# Patient Record
Sex: Male | Born: 1980 | Race: Black or African American | Hispanic: No | Marital: Married | Smoking: Never smoker
Health system: Southern US, Community
[De-identification: ages and names within clinical notes are randomized; demographics above are authoritative.]

---

## 2016-04-18 ENCOUNTER — Emergency Department (HOSPITAL_BASED_OUTPATIENT_CLINIC_OR_DEPARTMENT_OTHER)
Admission: EM | Admit: 2016-04-18 | Discharge: 2016-04-18 | Disposition: A | Discharge status: Home / Self Care | Payer: BLUE CROSS/BLUE SHIELD | Attending: Emergency Medicine | Admitting: Emergency Medicine

## 2016-04-18 ENCOUNTER — Encounter (HOSPITAL_BASED_OUTPATIENT_CLINIC_OR_DEPARTMENT_OTHER): Payer: Self-pay | Admitting: *Deleted

## 2016-04-18 DIAGNOSIS — M549 Dorsalgia, unspecified: Secondary | ICD-10-CM

## 2016-04-18 DIAGNOSIS — M545 Low back pain: Secondary | ICD-10-CM | POA: Insufficient documentation

## 2016-04-18 MED ORDER — METHOCARBAMOL 500 MG PO TABS: 500.0000 mg | ORAL_TABLET | Freq: Two times a day (BID) | ORAL | 0 refills | Status: DC

## 2016-04-18 MED ORDER — LIDO-CAPSAICIN-MEN-METHYL SAL 0.5-0.035-5-20 % EX PTCH: 1.0000 | MEDICATED_PATCH | Freq: Two times a day (BID) | CUTANEOUS | 0 refills | Status: AC | PRN

## 2016-04-18 MED FILL — METHOCARBAMOL 500 MG TABLET: 500 | 10 days supply | Qty: 20 | Fill #0

## 2016-04-18 NOTE — Discharge Instructions (Signed)
Take the prescribed medication as directed. °Follow-up with your primary care doctor. °Return to the ED for new or worsening symptoms. °

## 2016-04-18 NOTE — ED Provider Notes (Signed)
[facility]   CSN: [id number] Arrival date & time: 04/18/16  0820     History   Chief Complaint Chief Complaint  Patient presents with  . Back Pain    HPI Gerald Bell is a 35 y.o. male.  The history is provided by the patient and medical records.  Back Pain      35 year old male with no significant past medical history here with right lower back pain. Reports this is been ongoing for the past 2 days. He denies any injury, trauma, or falls. 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 This requires him to lift it above his head in place on his shoulder. States when he has change in position pain is worsened. He denies any radiation of pain into the legs. No numbness or weakness of the legs. No difficulty walking. No bowel or bladder incontinence. Patient states he did take some Motrin yesterday without any relief. No prior history of back injuries or surgeries in the past.  History reviewed. No pertinent past medical history.  There are no active problems to display for this patient.   History reviewed. No pertinent surgical history.     Home Medications    Prior to Admission medications   Not on File    Family History History reviewed. No pertinent family history.  Social History Social History  Substance Use Topics  . Smoking status: Never Smoker  . Smokeless tobacco: Never Used  . Alcohol use Not on file     Allergies   Patient has no known allergies.   Review of Systems Review of Systems  Musculoskeletal: Positive for back pain.  All other systems reviewed and are negative.    Physical Exam Updated Vital Signs BP 140/90 (BP Location: Right Arm)   Pulse 77   Temp 97.8 F (36.6 C) (Oral)   Resp 18   Ht 6\' 1"  (1.854 m)   Wt 86.2 kg   SpO2 100%   BMI 25.07 kg/m   Physical Exam  Constitutional: He is  oriented to person, place, and time. He appears well-developed and well-nourished.  HENT:  Head: Normocephalic and atraumatic.  Mouth/Throat: Oropharynx is clear and moist.  Eyes: Conjunctivae and EOM are normal. Pupils are equal, round, and reactive to light.  Neck: Normal range of motion.  Cardiovascular: Normal rate, regular rhythm and normal heart sounds.   Pulmonary/Chest: Effort normal and breath sounds normal.  Abdominal: Soft. Bowel sounds are normal.  Musculoskeletal: Normal range of motion.  Right lumbar paraspinal region with noted tenderness, no midline step-off or deformity, full range of motion maintained, normal strength and sensation of both legs, normal gait  Neurological: He is alert and oriented to person, place, and time.  Skin: Skin is warm and dry.  Psychiatric: He has a normal mood and affect.  Nursing note and vitals reviewed.    ED Treatments / Results  Labs (all labs ordered are listed, but only abnormal results are displayed) Labs Reviewed - No data to display  EKG  EKG Interpretation None       Radiology No results found.  Procedures Procedures (including critical care time)  Medications Ordered in ED Medications - No data to display   Initial Impression / Assessment and Plan / ED Course  I have reviewed the triage vital signs and the nursing notes.  Pertinent labs & imaging  results that were available during my care of the patient were reviewed by me and considered in my medical decision making (see chart for details).  Clinical Course    35 rolled male here with low back pain. Does report heavy lifting at work. Right lumbar paraspinal region with noted tenderness. No midline step-off or deformity. Neurologically intact without any signs or symptoms concerning for cauda equina. Suspect lumbar strain. Will treat symptomatically with Robaxin and lidocaine patch.  FU with PCP.  Discussed plan with patient, he acknowledged understanding and  agreed with plan of care.  Return precautions given for new or worsening symptoms.  Final Clinical Impressions(s) / ED Diagnoses   Final diagnoses:  Back pain, unspecified back location, unspecified back pain laterality, unspecified chronicity    New Prescriptions New Prescriptions   LIDO-CAPSAICIN-MEN-METHYL SAL (MEDI-PATCH-LIDOCAINE) 0.5-0.035-5-20 % PTCH    Apply 1 patch topically every 12 (twelve) hours as needed (back pain).   METHOCARBAMOL (ROBAXIN) 500 MG TABLET    Take 1 tablet (500 mg total) by mouth 2 (two) times daily.     Garlon HatchetLisa M Lounette Sloan, PA-C 04/18/16 [id number]    Heide Scaleshristopher J Tegeler, MD 04/18/16 2035

## 2016-04-18 NOTE — ED Triage Notes (Signed)
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 Denies specific injury or trauma.

## 2021-10-27 ENCOUNTER — Other Ambulatory Visit: Payer: Self-pay

## 2021-10-27 ENCOUNTER — Encounter (HOSPITAL_BASED_OUTPATIENT_CLINIC_OR_DEPARTMENT_OTHER): Payer: Self-pay

## 2021-10-27 ENCOUNTER — Emergency Department (HOSPITAL_BASED_OUTPATIENT_CLINIC_OR_DEPARTMENT_OTHER)
Admission: EM | Admit: 2021-10-27 | Discharge: 2021-10-27 | Disposition: A | Discharge status: Home / Self Care | Payer: BC Managed Care – PPO | Attending: Emergency Medicine | Admitting: Emergency Medicine

## 2021-10-27 ENCOUNTER — Emergency Department (HOSPITAL_BASED_OUTPATIENT_CLINIC_OR_DEPARTMENT_OTHER): Payer: BC Managed Care – PPO

## 2021-10-27 DIAGNOSIS — M19012 Primary osteoarthritis, left shoulder: Secondary | ICD-10-CM | POA: Insufficient documentation

## 2021-10-27 DIAGNOSIS — W230XXA Caught, crushed, jammed, or pinched between moving objects, initial encounter: Secondary | ICD-10-CM | POA: Insufficient documentation

## 2021-10-27 DIAGNOSIS — S46912A Strain of unspecified muscle, fascia and tendon at shoulder and upper arm level, left arm, initial encounter: Secondary | ICD-10-CM | POA: Diagnosis not present

## 2021-10-27 DIAGNOSIS — S4992XA Unspecified injury of left shoulder and upper arm, initial encounter: Secondary | ICD-10-CM | POA: Diagnosis present

## 2021-10-27 DIAGNOSIS — Y990 Civilian activity done for income or pay: Secondary | ICD-10-CM | POA: Insufficient documentation

## 2021-10-27 MED ORDER — DICLOFENAC SODIUM 1 % EX GEL: 2.0000 g | Freq: Four times a day (QID) | CUTANEOUS | 0 refills | Status: AC

## 2021-10-27 MED ORDER — NAPROXEN 500 MG PO TABS: 500.0000 mg | ORAL_TABLET | Freq: Two times a day (BID) | ORAL | 0 refills | Status: AC

## 2021-10-27 MED ORDER — METHOCARBAMOL 500 MG PO TABS: 500.0000 mg | ORAL_TABLET | Freq: Two times a day (BID) | ORAL | 0 refills | Status: AC

## 2021-10-27 NOTE — Discharge Instructions (Addendum)
Take the medications as prescribed to help with your symptoms. Your x-ray showed degenerative changes and calcifications near your shoulder joint that could be causing your symptoms. Complete the exercises to prevent stiffness and worsening pain. Return to the ER if you start to experience worsening symptoms, injuries or falls, trouble moving your arm, numbness, weakness

## 2021-10-27 NOTE — ED Triage Notes (Signed)
Patient states he was working on a table last night when he felt like something got "caught" in his left shoulder and neck causing pain

## 2021-10-27 NOTE — ED Provider Notes (Signed)
[facility] EMERGENCY DEPARTMENT Provider Note   CSN: [id number] Arrival date & time: 10/27/21  1013     History  Chief Complaint  Patient presents with   Shoulder Pain    Gerald Bell is a 41 y.o. male who presents to ED with a chief complaint of left shoulder pain.  States that last night he was working on a table when he started experiencing pain after he finished.  He did not take any medications to help with his pain.  Pain is worse with movement but denies any tenderness to palpation.  He has had bilateral shoulder surgeries before for "muscle tears when I was much younger playing football."  He denies any injuries or falls recently, numbness, weakness, neck stiffness, fever.   Shoulder Pain Associated symptoms: no fever        Home Medications Prior to Admission medications   Medication Sig Start Date End Date Taking? Authorizing Provider  diclofenac Sodium (VOLTAREN) 1 % GEL Apply 2 g topically 4 (four) times daily. 10/27/21  Yes Kristle Wesch, PA-C  Lido-Capsaicin-Men-Methyl Sal (MEDI-PATCH-LIDOCAINE) 0.5-0.035-5-20 % PTCH Apply 1 patch topically every 12 (twelve) hours as needed (back pain). 04/18/16   Garlon Hatchet, PA-C  methocarbamol (ROBAXIN) 500 MG tablet Take 1 tablet (500 mg total) by mouth 2 (two) times daily. 10/27/21  Yes Nela Bascom, PA-C  naproxen (NAPROSYN) 500 MG tablet Take 1 tablet (500 mg total) by mouth 2 (two) times daily. 10/27/21  Yes Patsy Varma, PA-C      Allergies    Patient has no known allergies.    Review of Systems   Review of Systems  Constitutional:  Negative for chills and fever.  Musculoskeletal:  Positive for myalgias.  Neurological:  Negative for weakness and numbness.    Physical Exam Updated Vital Signs BP (!) 139/91   Pulse 68   Temp 97.8 F (36.6 C) (Oral)   Resp 16   SpO2 100%  Physical Exam Vitals and nursing note reviewed.  Constitutional:      General: He is not in acute distress.    Appearance:  He is well-developed. He is not diaphoretic.  HENT:     Head: Normocephalic and atraumatic.  Eyes:     General: No scleral icterus.    Conjunctiva/sclera: Conjunctivae normal.  Pulmonary:     Effort: Pulmonary effort is normal. No respiratory distress.  Musculoskeletal:        General: No tenderness. Normal range of motion.     Cervical back: Normal range of motion.     Comments: No tenderness palpation of the cervical spine at the midline or paraspinal musculature.  No tenderness to palpation of the left shoulder.  No deformities.  2+ radial pulse palpated.  Normal range of motion of bilateral shoulders.  Skin:    Findings: No rash.  Neurological:     Mental Status: He is alert.     ED Results / Procedures / Treatments   Labs (all labs ordered are listed, but only abnormal results are displayed) Labs Reviewed - No data to display  EKG None  Radiology DG Shoulder Left  Result Date: 10/27/2021 CLINICAL DATA:  Pain EXAM: LEFT SHOULDER - 2+ VIEW COMPARISON:  None Available. FINDINGS: No fracture or dislocation is seen. Degenerative changes are noted with bony spurs in the inferior aspect of glenohumeral joint. There is small linear smooth marginated calcification adjacent to the proximal humerus seen in the axillary view. This may suggest calcific bursitis or tendinosis. IMPRESSION:  No fracture or dislocation is seen in the left shoulder. Degenerative changes are noted with bony spurs. Small linear calcification adjacent to the proximal humerus suggests possible calcific bursitis or calcific tendinosis. Electronically Signed   By: Ernie Avena M.D.   On: 10/27/2021 10:53    Procedures Procedures    Medications Ordered in ED Medications - No data to display  ED Course/ Medical Decision Making/ A&P                           Medical Decision Making Amount and/or Complexity of Data Reviewed Radiology: ordered.  Risk Prescription drug management.   41 year old male  presenting to the ED for left shoulder pain.  Symptoms began yesterday while he was working on a table.  Prior bilateral shoulder surgeries several decades ago due to overuse from sports.  He denies any injury or trauma, numbness, weakness, neck stiffness.  On exam there is no tenderness palpation cervical spine or the shoulder.  No deformities.  There is normal range of motion of bilateral shoulders.  Area is neurovascularly intact.  He is afebrile without recent use of antipyretic.  X-ray of the left shoulder shows degenerative changes and calcifications which could be causing his symptoms today.  Doubt infectious or vascular cause based on physical exam findings and history.  Will treat with muscle relaxer, NSAIDs and range of motion exercises.  Patient is agreeable to the plan.  Return precautions given  Patient is hemodynamically stable, in NAD, and able to ambulate in the ED. Evaluation does not show pathology that would require ongoing emergent intervention or inpatient treatment. I explained the diagnosis to the patient. Pain has been managed and has no complaints prior to discharge. Patient is comfortable with above plan and is stable for discharge at this time. All questions were answered prior to disposition. Strict return precautions for returning to the ED were discussed. Encouraged follow up with PCP.   An After Visit Summary was printed and given to the patient.   Portions of this note were generated with Scientist, clinical (histocompatibility and immunogenetics). Dictation errors may occur despite best attempts at proofreading.        Final Clinical Impression(s) / ED Diagnoses Final diagnoses:  Strain of left shoulder, initial encounter  Osteoarthritis of left shoulder, unspecified osteoarthritis type    Rx / DC Orders ED Discharge Orders          Ordered    methocarbamol (ROBAXIN) 500 MG tablet  2 times daily        10/27/21 1101    naproxen (NAPROSYN) 500 MG tablet  2 times daily        10/27/21 1101     diclofenac Sodium (VOLTAREN) 1 % GEL  4 times daily        10/27/21 1101              Dietrich Pates, PA-C 10/27/21 1103    Gwyneth Sprout, MD 11/03/21 0019

## 2023-06-05 IMAGING — CR DG SHOULDER 2+V*L*
3 series · 3 of 3 positions shown · non-contrast
Comparison: None Available.

CLINICAL DATA: Pain

EXAM:
LEFT SHOULDER - 2+ VIEW

[w shoulder grashey left]
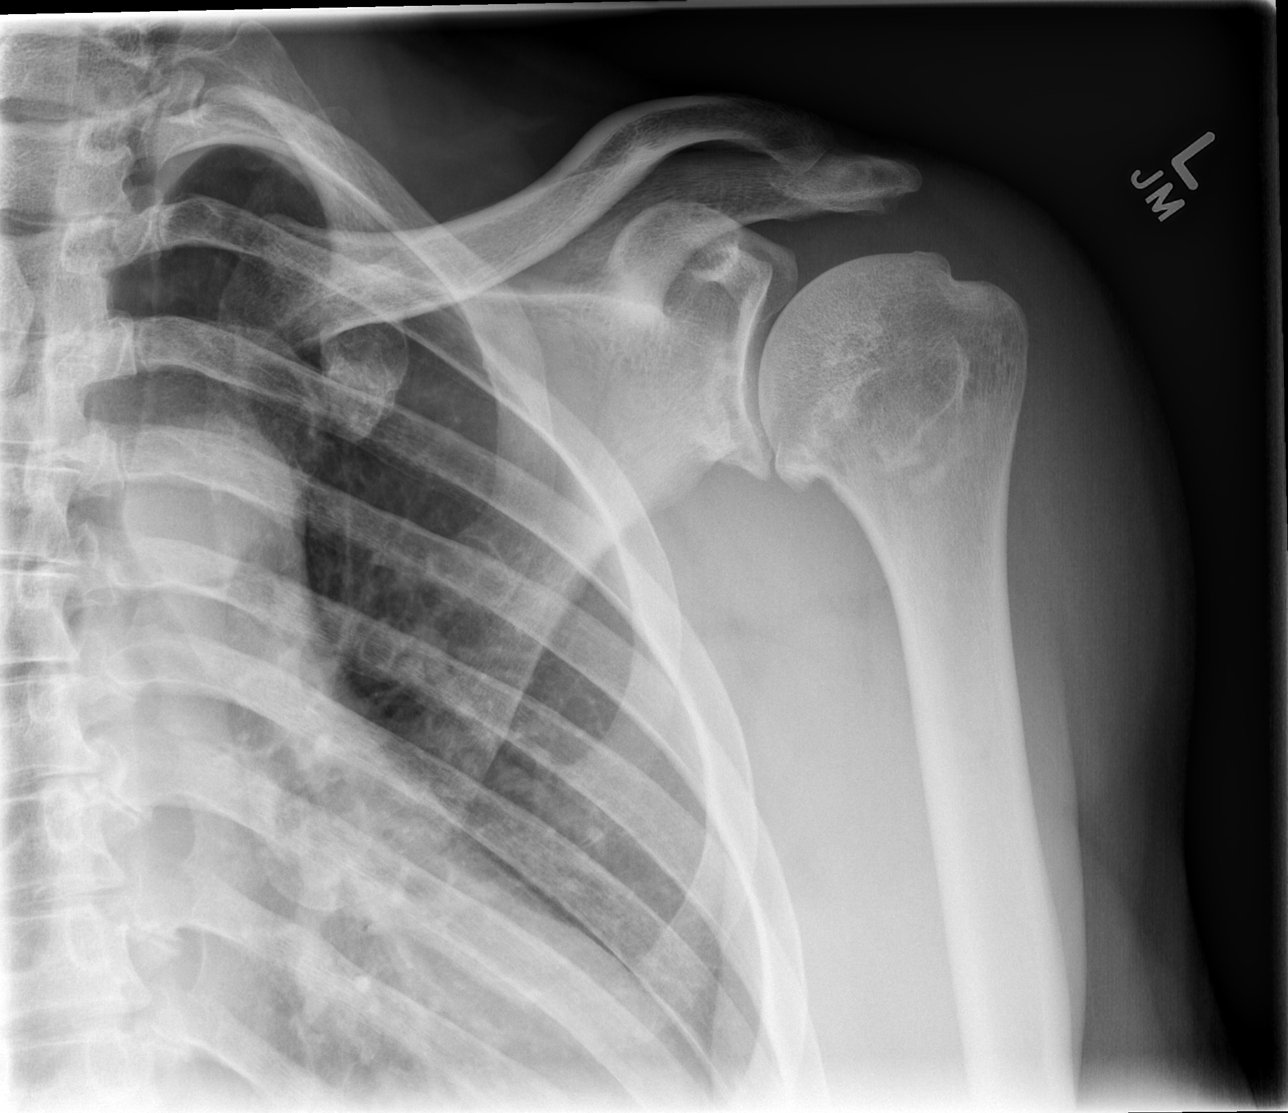

[w shoulder y view left]
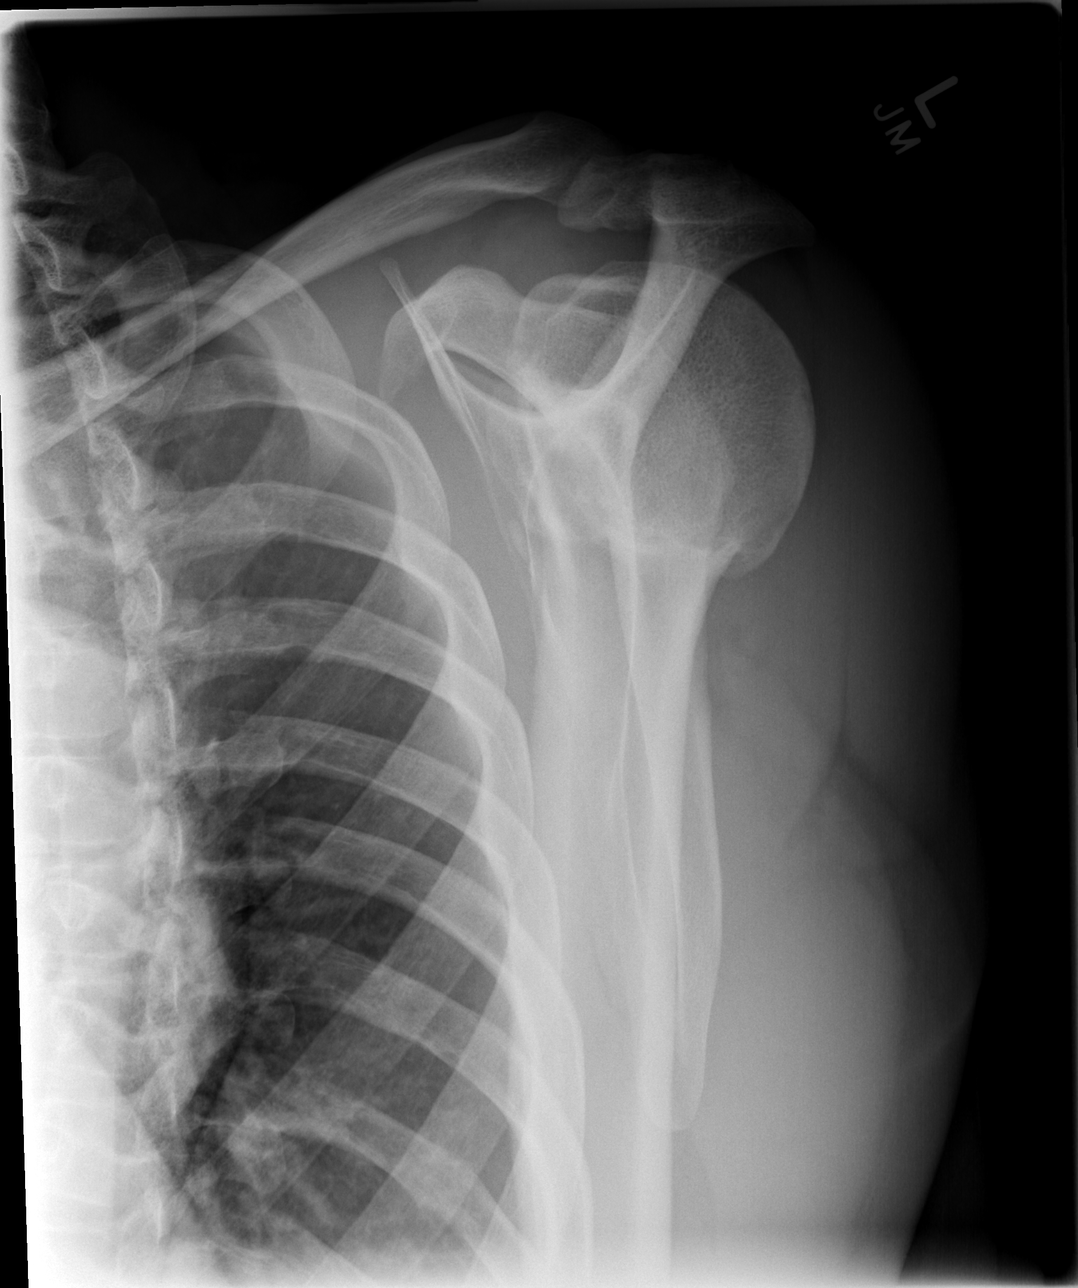

[w shoulder axillary left *]
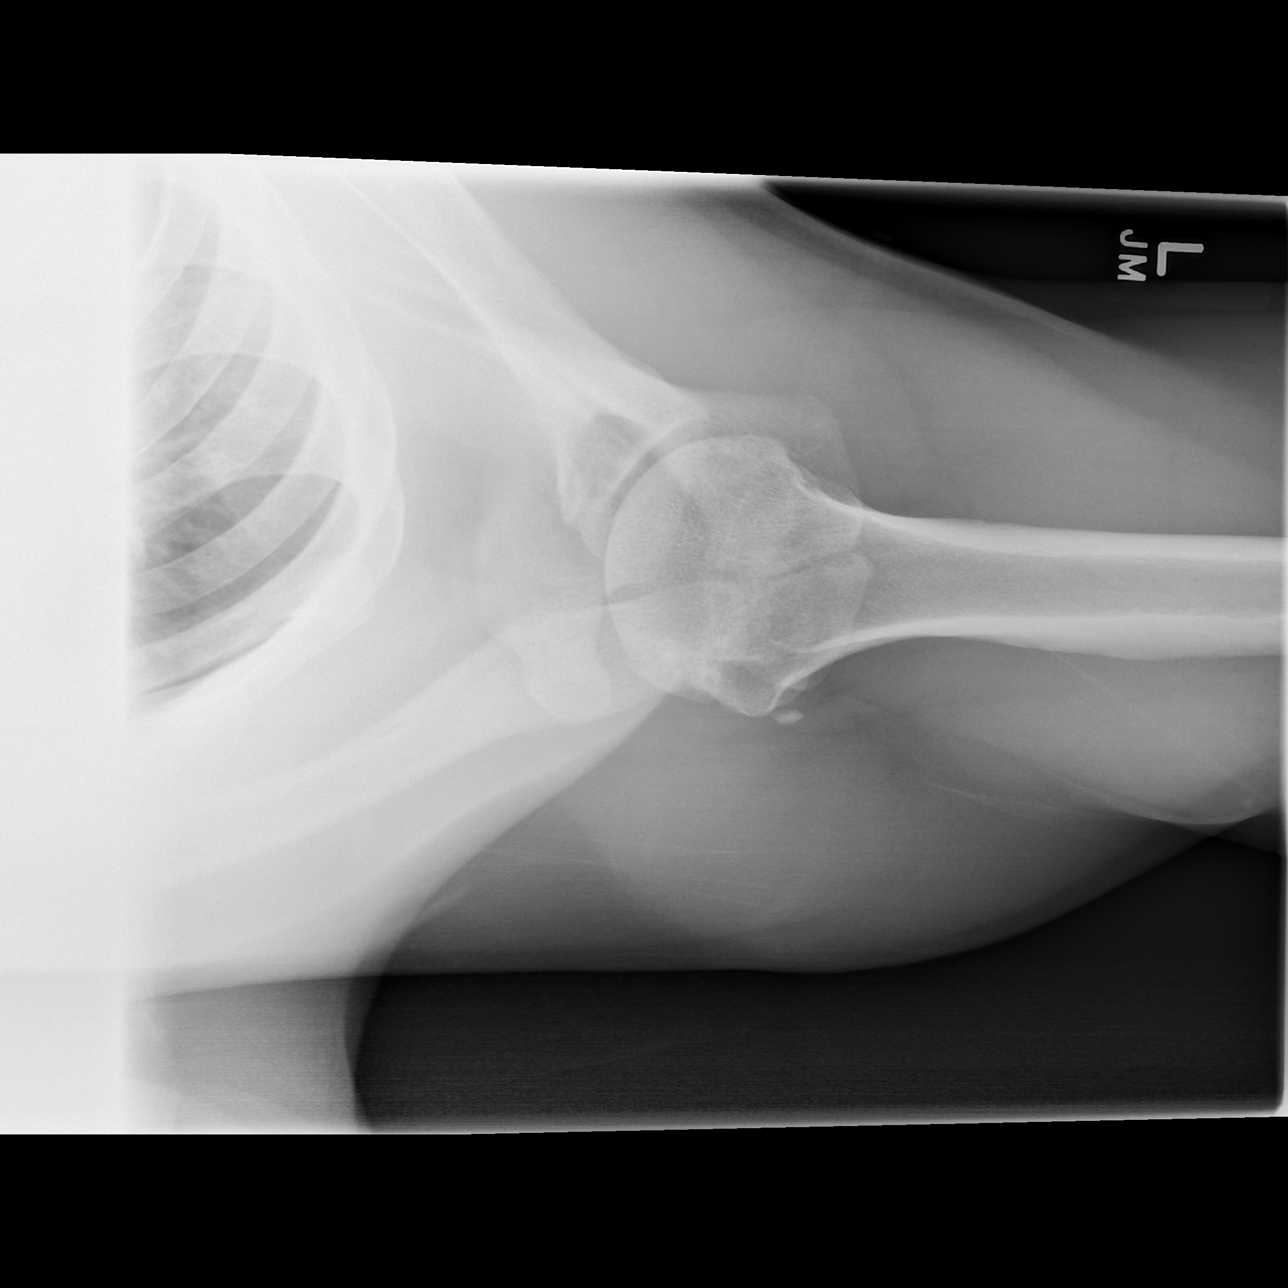

[3 of 3 positions shown; findings below may reference images not displayed]

FINDINGS: No fracture or dislocation is seen. Degenerative changes are noted
with bony spurs in the inferior aspect of glenohumeral joint. There
is small linear smooth marginated calcification adjacent to the
proximal humerus seen in the axillary view. This may suggest
calcific bursitis or tendinosis.
IMPRESSION: No fracture or dislocation is seen in the left shoulder.
Degenerative changes are noted with bony spurs. Small linear
calcification adjacent to the proximal humerus suggests possible
calcific bursitis or calcific tendinosis.
